# Patient Record
Sex: Female | Born: 2000 | Race: Black or African American | Hispanic: No | Marital: Single | Smoking: Never smoker
Health system: Southern US, Community
[De-identification: ages and names within clinical notes are randomized; demographics above are authoritative.]

## PROBLEM LIST (undated history)

## (undated) DIAGNOSIS — K5900 Constipation, unspecified: Secondary | ICD-10-CM

## (undated) HISTORY — PX: TONSILLECTOMY: SUR1361

---
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
## 2023-07-14 ENCOUNTER — Other Ambulatory Visit: Payer: Self-pay

## 2023-07-14 ENCOUNTER — Emergency Department (HOSPITAL_BASED_OUTPATIENT_CLINIC_OR_DEPARTMENT_OTHER)
Admission: EM | Admit: 2023-07-14 | Discharge: 2023-07-14 | Disposition: A | Discharge status: Home / Self Care | Attending: Emergency Medicine | Admitting: Emergency Medicine

## 2023-07-14 ENCOUNTER — Encounter (HOSPITAL_BASED_OUTPATIENT_CLINIC_OR_DEPARTMENT_OTHER): Payer: Self-pay | Admitting: Emergency Medicine

## 2023-07-14 DIAGNOSIS — J020 Streptococcal pharyngitis: Secondary | ICD-10-CM

## 2023-07-14 DIAGNOSIS — R112 Nausea with vomiting, unspecified: Secondary | ICD-10-CM

## 2023-07-14 DIAGNOSIS — R509 Fever, unspecified: Secondary | ICD-10-CM | POA: Diagnosis present

## 2023-07-14 DIAGNOSIS — B349 Viral infection, unspecified: Secondary | ICD-10-CM

## 2023-07-14 LAB — RESP PANEL BY RT-PCR (RSV, FLU A&B, COVID)  RVPGX2
Influenza A by PCR: NEGATIVE
Influenza B by PCR: NEGATIVE
Resp Syncytial Virus by PCR: NEGATIVE
SARS Coronavirus 2 by RT PCR: NEGATIVE

## 2023-07-14 LAB — GROUP A STREP BY PCR: Group A Strep by PCR: DETECTED — AB

## 2023-07-14 MED ORDER — ONDANSETRON HCL 4 MG/2ML IJ SOLN
4.0000 mg | Freq: Once | INTRAMUSCULAR | Status: DC
  Filled 2023-07-14: qty 2

## 2023-07-14 MED ORDER — ONDANSETRON 4 MG PO TBDP: 4.0000 mg | ORAL_TABLET | Freq: Three times a day (TID) | ORAL | 0 refills | Status: AC | PRN

## 2023-07-14 MED ORDER — ONDANSETRON 4 MG PO TBDP
4.0000 mg | ORAL_TABLET | Freq: Once | ORAL | Status: AC
  Administered 2023-07-14: 4 mg via ORAL
  Filled 2023-07-14: qty 1

## 2023-07-14 MED ORDER — SODIUM CHLORIDE 0.9 % IV BOLUS: 1000.0000 mL | Freq: Once | INTRAVENOUS | Status: DC

## 2023-07-14 MED ORDER — AZITHROMYCIN 250 MG PO TABS
500.0000 mg | ORAL_TABLET | Freq: Once | ORAL | Status: AC
  Administered 2023-07-14: 500 mg via ORAL
  Filled 2023-07-14: qty 2

## 2023-07-14 MED ORDER — AZITHROMYCIN 500 MG PO TABS
500.0000 mg | ORAL_TABLET | Freq: Every day | ORAL | 0 refills | Status: AC
Start: 2023-07-14 — End: 2023-07-16

## 2023-07-14 NOTE — ED Triage Notes (Signed)
 Patient reports headache, ear pain, n/v and fevers at home since yesterday. Tmax 102.

## 2023-07-14 NOTE — ED Provider Notes (Signed)
 [place] [facility] Provider Note   CSN: [id number] Arrival date & time: 07/14/23  4098     History  Chief Complaint  Patient presents with   Fever    Virginia Frank is a 23 y.o. female.  Patient here with nausea and vomiting since last night.  Nothing makes worse or better.  Some sinus congestion.  No diarrhea.  No abdominal pain.  No chest pain shortness of breath cough sputum production.  Has had some pain with her ears.  No significant medical history.  Nothing makes it worse or better.  She has taken Tylenol but nothing since last night.  Denies any sick contacts or suspicious food intake.  The history is provided by the patient.       Home Medications Prior to Admission medications   Medication Sig Start Date End Date Taking? Authorizing Provider  azithromycin (ZITHROMAX) 500 MG tablet Take 1 tablet (500 mg total) by mouth daily for 2 days. 07/14/23 07/16/23 Yes Karlisha Mathena, DO  ondansetron (ZOFRAN-ODT) 4 MG disintegrating tablet Take 1 tablet (4 mg total) by mouth every 8 (eight) hours as needed. 07/14/23  Yes Ariaunna Longsworth, DO  citalopram (CELEXA) 20 MG tablet Take 1/2 tablet for 1 week then increase to 1 tablet daily 12/04/18   Jhonny Moss, MD  Dapsone 5 % topical gel Apply topically. 10/02/17   [provider]  EPINEPHrine (EPIPEN 2-PAK) 0.3 mg/0.3 mL IJ SOAJ injection Inject 0.3 mg into the muscle Once PRN.    [provider]  FLUoxetine (PROZAC) 10 MG capsule Take 1 capsule (10 mg total) by mouth daily. 10/07/18   Hudnall, Fabienne Holter, MD  loratadine (CLARITIN) 10 MG tablet Take 10 mg by mouth daily.    [provider]  polyethylene glycol (MIRALAX / GLYCOLAX) packet Take 17 g by mouth daily.    [provider]  tretinoin (RETIN-A) 0.05 % cream Apply topically. 07/02/17   [provider]      Allergies    Gluten meal, Penicillins, and Shellfish allergy    Review of Systems   Review of  Systems  Physical Exam Updated Vital Signs BP 124/81   Pulse 93   Temp 97.6 F (36.4 C)   Resp 20   Ht 5\' 8"  (1.727 m)   Wt 68 kg   SpO2 100%   BMI 22.81 kg/m  Physical Exam Vitals and nursing note reviewed.  Constitutional:      General: She is not in acute distress.    Appearance: She is well-developed. She is not ill-appearing.  HENT:     Head: Normocephalic and atraumatic.     Mouth/Throat:     Mouth: Mucous membranes are dry.  Eyes:     Extraocular Movements: Extraocular movements intact.     Conjunctiva/sclera: Conjunctivae normal.     Pupils: Pupils are equal, round, and reactive to light.  Cardiovascular:     Rate and Rhythm: Normal rate and regular rhythm.     Pulses: Normal pulses.     Heart sounds: Normal heart sounds. No murmur heard. Pulmonary:     Effort: Pulmonary effort is normal. No respiratory distress.     Breath sounds: Normal breath sounds.  Abdominal:     General: Abdomen is flat.     Palpations: Abdomen is soft.     Tenderness: There is no abdominal tenderness.  Musculoskeletal:        General: No swelling.     Cervical back: Normal  range of motion and neck supple.  Skin:    General: Skin is warm and dry.     Capillary Refill: Capillary refill takes less than 2 seconds.  Neurological:     General: No focal deficit present.     Mental Status: She is alert.  Psychiatric:        Mood and Affect: Mood normal.     ED Results / Procedures / Treatments   Labs (all labs ordered are listed, but only abnormal results are displayed) Labs Reviewed  GROUP A STREP BY PCR - Abnormal; Notable for the following components:      Result Value   Group A Strep by PCR DETECTED (*)    All other components within normal limits  RESP PANEL BY RT-PCR (RSV, FLU A&B, COVID)  RVPGX2    EKG None  Radiology No results found.  Procedures Procedures    Medications Ordered in ED Medications  azithromycin (ZITHROMAX) tablet 500 mg (has no administration  in time range)  ondansetron (ZOFRAN-ODT) disintegrating tablet 4 mg (4 mg Oral Given 07/14/23 0720)    ED Course/ Medical Decision Making/ A&P                                 Medical Decision Making Amount and/or Complexity of Data Reviewed Labs: ordered.  Risk Prescription drug management.   Virginia Frank is here with nausea vomiting fever.  Normal vitals.  No fever upon arrival here.  Maybe fever last night.  Sick since last night with several episodes of emesis.  No diarrhea.  Nontender abdomen.  No pain with urination.  Denies any weakness numbness tingling.  Differential diagnosis likely viral process versus food poisoning.  Have no concern for any intra-abdominal process including appendicitis colitis.  No concern for pneumonia or UTI.  Will check for COVID flu RSV strep check basic labs give IV fluids and IV Zofran rehydrate.  7:34 AM nursing staff came to me and ultimately she actually decided against starting IV wanted to just get some p.o. Zofran ODT and orally hydrate which I think is also reasonable.  Awaiting viral panel.  Clinically have no major concern for dehydration or electrolyte abnormality.  Patient positive for strep pharyngitis.  Allergic to penicillin.  Will treat with Zithromax.  Will prescribe both that and Zofran.  Able to tolerate Gatorade.  Discharged in good condition.  Continue hydration at home.  Return if symptoms worsen.  This chart was dictated using voice recognition software.  Despite best efforts to proofread,  errors can occur which can change the documentation meaning.         Final Clinical Impression(s) / ED Diagnoses Final diagnoses:  Viral illness  Nausea and vomiting, unspecified vomiting type  Strep pharyngitis    Rx / DC Orders ED Discharge Orders          Ordered    ondansetron (ZOFRAN-ODT) 4 MG disintegrating tablet  Every 8 hours PRN        07/14/23 0720    azithromycin (ZITHROMAX) 500 MG tablet  Daily        07/14/23 0729               Lowery Rue, DO 07/14/23 [phone]

## 2023-07-14 NOTE — Discharge Instructions (Addendum)
 Continue Tylenol and ibuprofen for fever and bodyaches.  Continue Zofran as prescribed for nausea and vomiting.  Return if symptoms worsen.  Take your next dose antibiotic azithromycin tomorrow.  You tested positive for strep throat today.  You can follow-up your viral panel in your MyChart.

## 2023-07-14 NOTE — ED Notes (Signed)
 Unsuccessful IV attempt x1, pt refused IV attempt in hand or USIV. EDP notified

## 2024-05-01 ENCOUNTER — Encounter (HOSPITAL_BASED_OUTPATIENT_CLINIC_OR_DEPARTMENT_OTHER): Payer: Self-pay

## 2024-05-01 ENCOUNTER — Emergency Department (HOSPITAL_BASED_OUTPATIENT_CLINIC_OR_DEPARTMENT_OTHER)
Admission: EM | Admit: 2024-05-01 | Discharge: 2024-05-01 | Disposition: A | Discharge status: Home / Self Care | Attending: Emergency Medicine | Admitting: Emergency Medicine

## 2024-05-01 ENCOUNTER — Other Ambulatory Visit: Payer: Self-pay

## 2024-05-01 DIAGNOSIS — R3915 Urgency of urination: Secondary | ICD-10-CM | POA: Insufficient documentation

## 2024-05-01 DIAGNOSIS — R300 Dysuria: Secondary | ICD-10-CM | POA: Insufficient documentation

## 2024-05-01 DIAGNOSIS — R319 Hematuria, unspecified: Secondary | ICD-10-CM | POA: Diagnosis present

## 2024-05-01 DIAGNOSIS — R350 Frequency of micturition: Secondary | ICD-10-CM | POA: Diagnosis not present

## 2024-05-01 LAB — CBC WITH DIFFERENTIAL/PLATELET
Abs Immature Granulocytes: 0.03 10*3/uL (ref 0.00–0.07)
Basophils Absolute: 0 10*3/uL (ref 0.0–0.1)
Basophils Relative: 1 %
Eosinophils Absolute: 0.1 10*3/uL (ref 0.0–0.5)
Eosinophils Relative: 1 %
HCT: 41.2 % (ref 36.0–46.0)
Hemoglobin: 13.4 g/dL (ref 12.0–15.0)
Immature Granulocytes: 0 %
Lymphocytes Relative: 31 %
Lymphs Abs: 2.5 10*3/uL (ref 0.7–4.0)
MCH: 23.1 pg — ABNORMAL LOW (ref 26.0–34.0)
MCHC: 32.5 g/dL (ref 30.0–36.0)
MCV: 71.2 fL — ABNORMAL LOW (ref 80.0–100.0)
Monocytes Absolute: 0.5 10*3/uL (ref 0.1–1.0)
Monocytes Relative: 6 %
Neutro Abs: 4.9 10*3/uL (ref 1.7–7.7)
Neutrophils Relative %: 61 %
Platelets: 209 10*3/uL (ref 150–400)
RBC: 5.79 MIL/uL — ABNORMAL HIGH (ref 3.87–5.11)
RDW: 15.1 % (ref 11.5–15.5)
WBC: 8 10*3/uL (ref 4.0–10.5)
nRBC: 0 % (ref 0.0–0.2)

## 2024-05-01 LAB — URINALYSIS, ROUTINE W REFLEX MICROSCOPIC
Glucose, UA: NEGATIVE mg/dL
Specific Gravity, Urine: >=1.03 (ref 1.005–1.030)
pH: 6 (ref 5.0–8.0)

## 2024-05-01 LAB — COMPREHENSIVE METABOLIC PANEL WITH GFR
ALT: 18 U/L (ref 0–44)
AST: 29 U/L (ref 15–41)
Albumin: 5.2 g/dL — ABNORMAL HIGH (ref 3.5–5.0)
Alkaline Phosphatase: 63 U/L (ref 38–126)
Anion gap: 14 (ref 5–15)
BUN: 10 mg/dL (ref 6–20)
CO2: 23 mmol/L (ref 22–32)
Calcium: 10 mg/dL (ref 8.9–10.3)
Chloride: 105 mmol/L (ref 98–111)
Creatinine, Ser: 0.65 mg/dL (ref 0.44–1.00)
GFR, Estimated: >60 mL/min
Glucose, Bld: 95 mg/dL (ref 70–99)
Potassium: 3.9 mmol/L (ref 3.5–5.1)
Sodium: 142 mmol/L (ref 135–145)
Total Bilirubin: 0.9 mg/dL (ref 0.0–1.2)
Total Protein: 8.1 g/dL (ref 6.5–8.1)

## 2024-05-01 LAB — URINALYSIS, MICROSCOPIC (REFLEX): RBC / HPF: >50 RBC/hpf (ref 0–5)

## 2024-05-01 LAB — PREGNANCY, URINE: Preg Test, Ur: NEGATIVE

## 2024-05-01 LAB — CK: Total CK: 135 U/L (ref 38–234)

## 2024-05-01 MED ORDER — CEFADROXIL 500 MG PO CAPS: 500.0000 mg | ORAL_CAPSULE | Freq: Two times a day (BID) | ORAL | 0 refills | Status: AC

## 2024-05-01 NOTE — ED Notes (Signed)

## 2024-05-01 NOTE — Discharge Instructions (Addendum)
 It was a pleasure taking care of you here today.    We are treating this as a urinary tract infection.  Take as prescribed.  We did send a urine culture.  You will be called if your antibiotics need to be changed based off of this culture  Take your antibiotics with a meal as well as a full glass of water.  Make sure to follow-up outpatient, return for any worsening symptoms

## 2024-05-01 NOTE — ED Triage Notes (Signed)
 Pt reported blood-tinged urine this AM. Dark-red urine every time urinating.

## 2024-05-01 NOTE — ED Provider Notes (Signed)
 " [place] [facility] Provider Note   CSN: [id number] Arrival date & time: 05/01/24  1421     Patient presents with: Hematuria   Virginia Frank is a 24 y.o. female here for evaluation of hematuria, suprapubic fullness, urgency, frequency.  Started this morning.  No history of kidney stones, fever, vomiting, flank pain.  Sexually active, no concern for STI.  No changes in bowel movements.   HPI     Prior to Admission medications  Medication Sig Start Date End Date Taking? Authorizing Provider  cefadroxil  (DURICEF) 500 MG capsule Take 1 capsule (500 mg total) by mouth 2 (two) times daily for 5 days. 05/01/24 05/06/24 Yes Jani Moronta A, PA-C  citalopram  (CELEXA ) 20 MG tablet Take 1/2 tablet for 1 week then increase to 1 tablet daily 12/04/18   Marsa Duncans, MD  Dapsone 5 % topical gel Apply topically. 10/02/17   [provider]  EPINEPHrine (EPIPEN 2-PAK) 0.3 mg/0.3 mL IJ SOAJ injection Inject 0.3 mg into the muscle Once PRN.    [provider]  FLUoxetine  (PROZAC ) 10 MG capsule Take 1 capsule (10 mg total) by mouth daily. 10/07/18   Hudnall, Ludie SAUNDERS, MD  loratadine (CLARITIN) 10 MG tablet Take 10 mg by mouth daily.    [provider]  ondansetron  (ZOFRAN -ODT) 4 MG disintegrating tablet Take 1 tablet (4 mg total) by mouth every 8 (eight) hours as needed. 07/14/23   Curatolo, Adam, DO  polyethylene glycol (MIRALAX / GLYCOLAX) packet Take 17 g by mouth daily.    [provider]  tretinoin (RETIN-A) 0.05 % cream Apply topically. 07/02/17   [provider]    Allergies: Gluten meal, Penicillins, and Shellfish allergy    Review of Systems  Constitutional: Negative.   HENT: Negative.    Respiratory: Negative.    Cardiovascular: Negative.   Gastrointestinal: Negative.   Genitourinary:  Positive for dysuria, frequency, hematuria and urgency. Negative for difficulty urinating, dyspareunia, flank pain, genital  sores, menstrual problem and pelvic pain.  Musculoskeletal: Negative.   Skin: Negative.   Neurological: Negative.   All other systems reviewed and are negative.   Updated Vital Signs BP 119/79   Pulse 72   Temp 98.2 F (36.8 C)   Ht 5' 7 (1.702 m)   Wt 72.6 kg   LMP 04/10/2024 (Approximate)   SpO2 100%   BMI 25.06 kg/m   Physical Exam Vitals and nursing note reviewed.  Constitutional:      General: She is not in acute distress.    Appearance: She is well-developed. She is not ill-appearing or toxic-appearing.  HENT:     Head: Atraumatic.  Eyes:     Pupils: Pupils are equal, round, and reactive to light.  Cardiovascular:     Rate and Rhythm: Normal rate.     Pulses: Normal pulses.     Heart sounds: Normal heart sounds.  Pulmonary:     Effort: Pulmonary effort is normal. No respiratory distress.     Breath sounds: Normal breath sounds.  Abdominal:     General: Bowel sounds are normal. There is no distension.     Palpations: Abdomen is soft.     Tenderness: There is no abdominal tenderness. There is no right CVA tenderness, left CVA tenderness or guarding.  Musculoskeletal:        General: Normal range of motion.     Cervical back: Normal range of motion.  Skin:    General: Skin is warm and  dry.     Capillary Refill: Capillary refill takes less than 2 seconds.  Neurological:     General: No focal deficit present.     Mental Status: She is alert.     Cranial Nerves: No cranial nerve deficit.     Motor: No weakness.     Gait: Gait normal.  Psychiatric:        Mood and Affect: Mood normal.     (all labs ordered are listed, but only abnormal results are displayed) Labs Reviewed  URINALYSIS, ROUTINE W REFLEX MICROSCOPIC - Abnormal; Notable for the following components:      Result Value   Color, Urine AMBER (*)    APPearance CLOUDY (*)    Hgb urine dipstick   (*)    Value: TEST NOT REPORTED DUE TO COLOR INTERFERENCE OF URINE PIGMENT   Bilirubin Urine SMALL (*)     Ketones, ur   (*)    Value: TEST NOT REPORTED DUE TO COLOR INTERFERENCE OF URINE PIGMENT   Protein, ur   (*)    Value: TEST NOT REPORTED DUE TO COLOR INTERFERENCE OF URINE PIGMENT   Nitrite   (*)    Value: TEST NOT REPORTED DUE TO COLOR INTERFERENCE OF URINE PIGMENT   Leukocytes,Ua   (*)    Value: TEST NOT REPORTED DUE TO COLOR INTERFERENCE OF URINE PIGMENT   All other components within normal limits  CBC WITH DIFFERENTIAL/PLATELET - Abnormal; Notable for the following components:   RBC 5.79 (*)    MCV 71.2 (*)    MCH 23.1 (*)    All other components within normal limits  COMPREHENSIVE METABOLIC PANEL WITH GFR - Abnormal; Notable for the following components:   Albumin 5.2 (*)    All other components within normal limits  URINALYSIS, MICROSCOPIC (REFLEX) - Abnormal; Notable for the following components:   Bacteria, UA FEW (*)    All other components within normal limits  URINE CULTURE  PREGNANCY, URINE  CK    EKG: None  Radiology: No results found.   Procedures   Medications Ordered in the ED - No data to display  24 year old here for evaluation of hematuria, frequency, urgency and suprapubic fullness.  Started this morning.  Here she is afebrile, nonseptic, non-ill-appearing.  She has negative CVA tap.  Abdomen soft, nontender.  Appears clinically well-hydrated.  No history of kidney stones.  Plan on labs, UA  Labs personally viewed interpreted CBC without leukocytosis Metabolic panel without significant abnormality Urine pregnancy negative UA with hematuria, WBC 11-20--remaining not tested due to blood  Patient reassessed.  Discussed labs and imaging with patient, family at bedside.  Will treat for UTI, urine culture sent.  Low suspicion for rhabdomyolysis, obstruction, perforation, kidney stone/infected kidney stone, pyelonephritis, TOA, PID, torsion.  Will have follow-up outpatient, return for new or worsening symptoms  The patient has been appropriately  medically screened and/or stabilized in the ED. I have low suspicion for any other emergent medical condition which would require further screening, evaluation or treatment in the ED or require inpatient management.  Patient is hemodynamically stable and in no acute distress.  Patient able to ambulate in department prior to ED.  Evaluation does not show acute pathology that would require ongoing or additional emergent interventions while in the emergency department or further inpatient treatment.  I have discussed the diagnosis with the patient and answered all questions.  Pain is been managed while in the emergency department and patient has no further complaints prior to  discharge.  Patient is comfortable with plan discussed in room and is stable for discharge at this time.  I have discussed strict return precautions for returning to the emergency department.  Patient was encouraged to follow-up with PCP/specialist refer to at discharge.                                   Medical Decision Making Amount and/or Complexity of Data Reviewed Independent Historian: parent External Data Reviewed: labs and notes. Labs: ordered. Decision-making details documented in ED Course.  Risk OTC drugs. Prescription drug management. Decision regarding hospitalization. Diagnosis or treatment significantly limited by social determinants of health.       Final diagnoses:  Hematuria, unspecified type  Dysuria    ED Discharge Orders          Ordered    cefadroxil  (DURICEF) 500 MG capsule  2 times daily        05/01/24 1608               Shontel Santee A, PA-C 05/01/24 1640    Ruthe Cornet, DO 05/02/24 [id number]  "

## 2024-05-02 LAB — URINE CULTURE: Culture: NO GROWTH
